# Patient Record
Sex: Female | Born: 1975 | Race: White | Hispanic: No | Marital: Married | Smoking: Never smoker
Health system: Southern US, Community
[De-identification: ages and names within clinical notes are randomized; demographics above are authoritative.]

## PROBLEM LIST (undated history)

## (undated) DIAGNOSIS — I10 Essential (primary) hypertension: Secondary | ICD-10-CM

## (undated) HISTORY — DX: Essential (primary) hypertension: I10

## (undated) HISTORY — PX: FRACTURE SURGERY: SHX138

---

## 2001-06-27 ENCOUNTER — Other Ambulatory Visit: Admission: RE | Admit: 2001-06-27 | Discharge: 2001-06-27 | Payer: Self-pay | Admitting: Obstetrics and Gynecology

## 2002-10-08 ENCOUNTER — Other Ambulatory Visit: Admission: RE | Admit: 2002-10-08 | Discharge: 2002-10-08 | Payer: Self-pay | Admitting: Obstetrics and Gynecology

## 2003-10-29 ENCOUNTER — Other Ambulatory Visit: Admission: RE | Admit: 2003-10-29 | Discharge: 2003-10-29 | Payer: Self-pay | Admitting: Obstetrics and Gynecology

## 2003-11-26 ENCOUNTER — Ambulatory Visit (HOSPITAL_COMMUNITY): Admission: RE | Admit: 2003-11-26 | Discharge: 2003-11-26 | Payer: Self-pay | Admitting: Internal Medicine

## 2004-10-11 ENCOUNTER — Emergency Department (HOSPITAL_COMMUNITY): Admission: EM | Admit: 2004-10-11 | Discharge: 2004-10-11 | Payer: Self-pay | Admitting: Emergency Medicine

## 2005-02-23 ENCOUNTER — Inpatient Hospital Stay (HOSPITAL_COMMUNITY): Admission: AD | Admit: 2005-02-23 | Discharge: 2005-02-23 | Payer: Self-pay | Admitting: Obstetrics and Gynecology

## 2005-03-02 ENCOUNTER — Ambulatory Visit (HOSPITAL_COMMUNITY): Admission: RE | Admit: 2005-03-02 | Discharge: 2005-03-02 | Payer: Self-pay | Admitting: Obstetrics and Gynecology

## 2005-03-07 ENCOUNTER — Inpatient Hospital Stay (HOSPITAL_COMMUNITY): Admission: AD | Admit: 2005-03-07 | Discharge: 2005-03-07 | Payer: Self-pay | Admitting: Obstetrics and Gynecology

## 2005-03-08 ENCOUNTER — Inpatient Hospital Stay (HOSPITAL_COMMUNITY): Admission: RE | Admit: 2005-03-08 | Discharge: 2005-03-11 | Payer: Self-pay | Admitting: Obstetrics and Gynecology

## 2005-03-08 ENCOUNTER — Encounter (INDEPENDENT_AMBULATORY_CARE_PROVIDER_SITE_OTHER): Payer: Self-pay | Admitting: Specialist

## 2005-03-12 ENCOUNTER — Encounter: Admission: RE | Admit: 2005-03-12 | Discharge: 2005-04-11 | Payer: Self-pay | Admitting: Obstetrics and Gynecology

## 2005-04-20 ENCOUNTER — Other Ambulatory Visit: Admission: RE | Admit: 2005-04-20 | Discharge: 2005-04-20 | Payer: Self-pay | Admitting: Obstetrics and Gynecology

## 2005-05-12 ENCOUNTER — Encounter: Admission: RE | Admit: 2005-05-12 | Discharge: 2005-06-02 | Payer: Self-pay | Admitting: Obstetrics and Gynecology

## 2006-08-08 ENCOUNTER — Other Ambulatory Visit: Admission: RE | Admit: 2006-08-08 | Discharge: 2006-08-08 | Payer: Self-pay | Admitting: Obstetrics and Gynecology

## 2007-07-23 ENCOUNTER — Other Ambulatory Visit: Admission: RE | Admit: 2007-07-23 | Discharge: 2007-07-23 | Payer: Self-pay | Admitting: Gynecology

## 2007-07-24 ENCOUNTER — Ambulatory Visit (HOSPITAL_COMMUNITY): Admission: RE | Admit: 2007-07-24 | Discharge: 2007-07-24 | Payer: Self-pay | Admitting: Gynecology

## 2008-09-18 ENCOUNTER — Other Ambulatory Visit: Admission: RE | Admit: 2008-09-18 | Discharge: 2008-09-18 | Payer: Self-pay | Admitting: Gynecology

## 2009-05-27 IMAGING — RF DG HYSTEROGRAM
6 series · 6 of 6 positions shown · non-contrast
Comparison: none

CLINICAL DATA: infertility
HYSTEROSALPINGOGRAM:
TECHNIQUE: The exam was performed by Dr. Gaber.  Six images are submitted for interpretation.

[Series 1: run · 1 of 1 slices shown (1 of 6)]
[im 1/1]
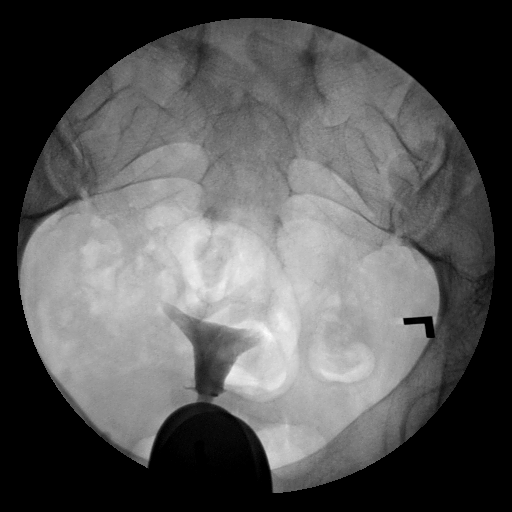

[Series 2: run · 1 of 1 slices shown (2 of 6)]
[im 1/1]
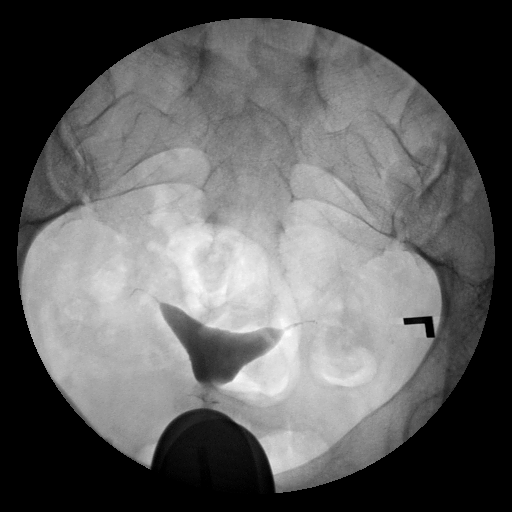

[Series 3: run · 1 of 1 slices shown (3 of 6)]
[im 1/1]
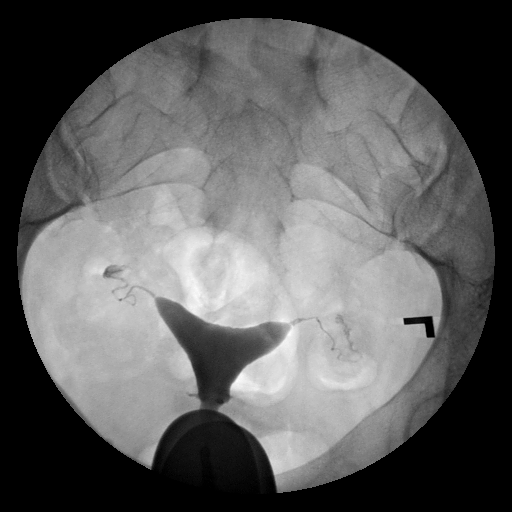

[Series 4: run · 1 of 1 slices shown (4 of 6)]
[im 1/1]
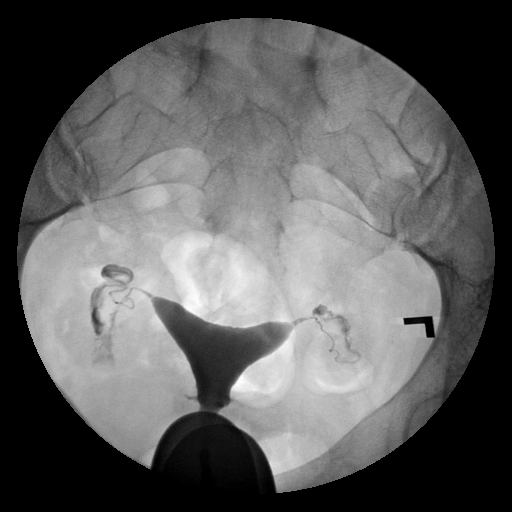

[Series 5: run · 1 of 1 slices shown (5 of 6)]
[im 1/1]
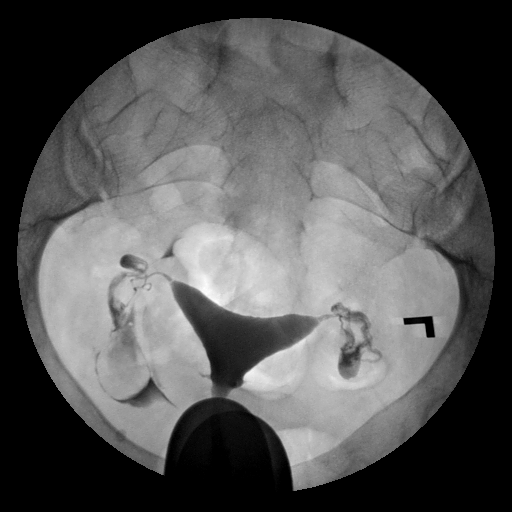

[Series 6: run · 1 of 1 slices shown (6 of 6)]
[im 1/1]
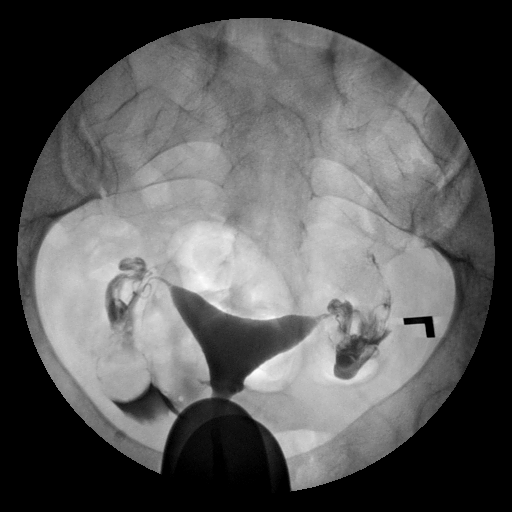

[6 of 6 positions shown; findings below may reference images not displayed]

FINDINGS: The uterus has a normal appearance with no evidence of fixed filling defects.  Both fallopian tubes fill and have a normal appearance, as well.  There is free spill of contrast into both adnexal regions.
IMPRESSION: Normal HSG.

## 2009-06-29 ENCOUNTER — Inpatient Hospital Stay (HOSPITAL_COMMUNITY): Admission: RE | Admit: 2009-06-29 | Discharge: 2009-07-02 | Payer: Self-pay | Admitting: Obstetrics and Gynecology

## 2010-12-19 ENCOUNTER — Encounter: Payer: Self-pay | Admitting: Obstetrics and Gynecology

## 2011-03-05 LAB — CBC
HCT: 26.4 % — ABNORMAL LOW (ref 36.0–46.0)
HCT: 31.7 % — ABNORMAL LOW (ref 36.0–46.0)
Hemoglobin: 10.5 g/dL — ABNORMAL LOW (ref 12.0–15.0)
Hemoglobin: 9 g/dL — ABNORMAL LOW (ref 12.0–15.0)
MCHC: 33.2 g/dL (ref 30.0–36.0)
MCHC: 34 g/dL (ref 30.0–36.0)
MCV: 76.7 fL — ABNORMAL LOW (ref 78.0–100.0)
MCV: 76.9 fL — ABNORMAL LOW (ref 78.0–100.0)
Platelets: 119 10*3/uL — ABNORMAL LOW (ref 150–400)
Platelets: 150 10*3/uL (ref 150–400)
RBC: 3.44 MIL/uL — ABNORMAL LOW (ref 3.87–5.11)
RBC: 4.12 MIL/uL (ref 3.87–5.11)
RDW: 16.8 % — ABNORMAL HIGH (ref 11.5–15.5)
RDW: 17 % — ABNORMAL HIGH (ref 11.5–15.5)
WBC: 7.7 10*3/uL (ref 4.0–10.5)
WBC: 8.9 10*3/uL (ref 4.0–10.5)

## 2011-03-05 LAB — RPR: RPR Ser Ql: NONREACTIVE

## 2011-04-12 NOTE — H&P (Signed)
Lauren Serrano, [name]             ACCOUNT NO.:  [id number]   MEDICAL RECORD NO.:  [id number]          PATIENT TYPE:  INP   LOCATION:  9139                          [facility]   PHYSICIAN:  Hal Morales, M.D.DATE OF BIRTH:  01-Nov-1976   DATE OF ADMISSION:  06/29/2009  DATE OF DISCHARGE:                              HISTORY & PHYSICAL   A 35 year old gravida 2, para 1 with last menstrual period September 27, 2008, equals an [facility] of July 04, 2009, presents to the [facility]  for repeat C-section due to large infant.  Ultrasound 1 week ago showed  the baby to be 8 pounds 12 ounces.   ADMITTING DIAGNOSES:  1. Intrauterine pregnancy, term.  2. History of macrosomia with previous pregnancy in September 2008.  3. Previous cesarean section, epidural, and elevated blood pressure.   Prenatal care began December 29, 2008.   LABORATORY DATA:  She is O positive, antibody screen nonreactive, RPR  nonreactive, HIV nonreactive, GC/CT negative.  Hemoglobin 11.4 and  platelets 187.  HBsAg is negative, rubella immune.  Pap within normal  limits.  First-trimester screen and AFI were in normal limits.  One-hour  Glucola was 141.  Three-hour was done.  GBS is positive.  AFI was within  normal limits.  Cervical length 4.06.  Placenta was anterior.   PAST OBSTETRIC HISTORY:  In 2006, a viable female infant, weighing 9  pounds 8 ounces by primary C-section due to elevated blood pressure,  present pregnancy.   MEDICAL HISTORY:  She has a history of infertility.  She was seen by Dr.  Chevis Pretty, Follistim was given with IUI; history of PIH; history of no known  drug allergies; previous pregnancy-induced hypertension, last trimester,  last pregnancy.   PAST SURGICAL HISTORY:  She had a primary C-section for failure to  progress.  She had a fracture noted in 2006.   ALLERGIES:  No known drug allergies or drugs.   FAMILY HISTORY:  Heart disease in mother and her maternal grandmother,  both have  heart disease and hypertension; her father is diabetic, IDDM;  cancer in her paternal grandmother, had lung cancer; and maternal  grandfather had brain cancer.   GENETIC HISTORY:  Noncontributory.   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  Denies smoking, alcohol, or drugs.  She did take Keflex  this pregnancy for urinary tract infection.  Has 1 daughter named, Victorino Serrano,  born in 2006 per primary C-section.   REVIEW OF SYMPTOMS:  GENERAL:  Denies headaches, dizziness, shortness of  breath, baby active.     EENT:  Grossly normal.  HEART:  Regular rate without murmur.  LUNGS:  Clear bilaterally.  ABDOMEN:  C-section scar, gravid at 8 pounds 12  ounces per ultrasound 1 week ago.  PELVIC:  Deferred.  No contractions.   ASSESSMENT:  Intrauterine pregnancy at term,  repeat cesarean section  versus vaginal birth after cesarean section, elected to have a repeat  cesarean section.  The risks and benefits were discussed per Dr.  Pennie Rushing, and she does have positive group  B streptococcus.   PLAN:  Admit to [facility] under services of [place], Dr. Pennie Rushing, anesthesia per Anesthesia Department.  Routine  postpartum care.  Routine C-section care.      Lauren Serrano, CNM      Hal Morales, M.D.  Electronically Signed    JM/MEDQ  D:  06/29/2009  T:  06/30/2009  Job:  [id number]

## 2011-04-12 NOTE — Op Note (Signed)
Lauren Serrano, Lauren Serrano             ACCOUNT NO.:  [id number]   MEDICAL RECORD NO.:  [id number]          PATIENT TYPE:  INP   LOCATION:  9139                          [facility]   PHYSICIAN:  Hal Morales, M.D.DATE OF BIRTH:  01-27-1976   DATE OF PROCEDURE:  06/29/2009  DATE OF DISCHARGE:                               OPERATIVE REPORT   PREOPERATIVE DIAGNOSES:  Intrauterine pregnancy at term, prior cesarean  section, desire for repeat cesarean section, history of macrosomia.   POSTOPERATIVE DIAGNOSES:  Intrauterine pregnancy at term, prior cesarean  section, desire for repeat cesarean section, history of macrosomia, plus  fetal macrosomia.   PROCEDURE:  Repeat low-transverse cesarean section.   SURGEON:  Hal Morales, MD   FIRST ASSISTANT:  Jasmine Awe, certified nurse midwife.   ANESTHESIA:  Spinal.   ESTIMATED BLOOD LOSS:  600 mL.   COMPLICATIONS:  None.   FINDINGS:  The patient was delivered of a female infant whose name is  Barnetta Chapel, weighing 9 pounds and 9 ounces with Apgars of 9 and 9 at 1 and 5  minutes respectively.  The uterus, tubes, and ovaries were normal for  gravid state.  The placenta contained an eccentrically inserted 3-vessel  cord.   PROCEDURE:  The patient was taken to the operating room after  appropriate identification and placed on the operating table.  After the  placement of a spinal anesthetic, she was placed in the supine position  with a left lateral tilt.  The abdomen and perineum were prepped with  multiple layers of Betadine and a Foley catheter inserted into the  bladder and connected to straight drainage.  The abdomen was draped as a  sterile field.  After the assurance of adequate surgical anesthesia, the  suprapubic site at the previous cesarean section incision was  infiltrated with 20 mL of 0.25% Marcaine.  This area was incised and the  incision taken through the layers into the peritoneum.  The bladder  blade was  placed.  The uterus was incised approximately 2 cm above the  uterovesical fold and that incision taken laterally on either side  bluntly.  The infant was delivered with the aid of a kiwi vacuum  extractor and after having the nares and pharynx suctioned and a double  loop of nuchal cord reduced was handed off to the awaiting  pediatricians.  The appropriate cord blood was drawn.  The placenta was  allowed to separate from the uterus and was removed from the operative  field.  The uterine incision was closed with running interlocking suture  of 0 Vicryl.  An imbricating suture of 0 Vicryl was placed with adequate  hemostasis.  Copious irrigation was carried out.  The abdominal  peritoneum was closed with running suture of 2-0 Vicryl.  The rectus  muscles were approximated in the midline with figure-of-eight suture of  2-0 Vicryl.  The rectus fascia was closed with running suture of 0  Vicryl then reinforced on either side of midline with figure-of-eight  sutures of 0 Vicryl.  Subcutaneous tissue was made hemostatic with Bovie  cautery and irrigated.  A  subcutaneous 7-mm Jackson-Pratt drain was  placed through a stab wound in the left lower quadrant and sewn in with  a suture of 0 silk.  The skin incision was closed with a  subcuticular suture of 3-0 Monocryl.  Steri-Strips were applied and a  sterile dressing applied.  The patient was taken from the operating room  to the recovery room in satisfactory condition, having tolerated the  procedure well with sponge and instrument counts correct.  The infant  went to the full-term nursery.      Hal Morales, M.D.  Electronically Signed     VPH/MEDQ  D:  06/29/2009  T:  06/30/2009  Job:  [id number]

## 2011-04-12 NOTE — Discharge Summary (Signed)
Lauren Serrano, [name]             ACCOUNT NO.:  [id number]   MEDICAL RECORD NO.:  [id number]          PATIENT TYPE:  INP   LOCATION:  9139                          [facility]   PHYSICIAN:  Hal Morales, M.D.DATE OF BIRTH:  Dec 21, 1975   DATE OF ADMISSION:  06/29/2009  DATE OF DISCHARGE:  07/02/2009                               DISCHARGE SUMMARY   ADMITTING DIAGNOSES:  1. Intrauterine pregnancy at term.  2. History of Macrosomia.  3. Previous cesarean section with desire for repeat.   PROCEDURE:  Repeat low transverse cesarean section.   HOSPITAL COURSE:  Ms. Rota is a 35 year old gravida 2, para 1-0-0-1  at 39-2/7 weeks, who presented on the day of admission for a repeat  cesarean section secondary to anticipated macrosomia.  The patient's  pregnancy had been remarkable for;  1. Previous C-section with original desire to VBAC; however, now      desire for repeat C-section.  2. History of fetal macrosomia.  3. History of elevated blood pressure in previous pregnancy.   Hemoglobin on day of admission was 10.5, white blood cell count was 7.7,  and platelet count was 150.  The patient was taken to the operating room  by Dr. Pennie Rushing, where a repeat low transverse cesarean section was  performed under spinal anesthesia.  Findings were a viable female by the  name of Lauren Serrano, weight 9 pounds 9 ounces, Apgars were 9 and 9.  The  infant was taken to the Full-Term Nursery.  Mother was taken to Recovery  in good condition.  The patient also was positive group B strep.  The  patient also had a fetal heart rate arrhythmia noted.  There was a  postnatal EKG scheduled in the nursery.  This was done and the baby was  cleared for discharge.  On postop day #1, hemoglobin was 9, hematocrit  was 26.4, white blood cell count was 8.9 and platelet count was 119.  The patient was up ad lib.  She was doing well.  She is having no  syncopal episodes or dizziness.  She had a JP drain that was  draining a  small amount of serosanguineous fluid.  Her incision was clean, dry and  intact.  Physical exam was within normal limits.  She was using Percocet  and Motrin for pain.  The rest of patient's postop care was  uncomplicated.  She was breastfeeding.   By postop day #3. the patient was doing well.  She was planning to use  Micronor.  Infant was cleared for circ and discharge after a normal EKG.  There were some irregular beats noted, but no pathology was identified.  Her incision was clean, dry and intact.  Her JP drain was removed  without difficulty.  Her fundus was firm and her lochia was scant.  She  was deemed to receive full benefit of her hospital stay and was  discharged home in stable condition.   DISCHARGE INSTRUCTIONS:  Per [facility] handout.   DISCHARGE MEDICATIONS:  1. Motrin 600 mg p.o. q.6 h. p.r.n. pain.  2. Percocet 5/325 one to two  p.o. daily 3-4 hours p.r.n. pain.  3. Micronor 1 p.o. daily.  4. The patient will also be 1 tablet of over-the-counter iron      supplement daily.   DISCHARGE FOLLOWUP:  Occur in 6 weeks, [facility].      Renaldo Reel Emilee Hero, C.N.M.      Hal Morales, M.D.  Electronically Signed    VLL/MEDQ  D:  07/02/2009  T:  07/02/2009  Job:  [id number]

## 2011-04-15 NOTE — H&P (Signed)
Lauren Serrano, Lauren Serrano             ACCOUNT NO.:  [id number]   MEDICAL RECORD NO.:  [id number]          PATIENT TYPE:  INP   LOCATION:  NA                            [facility]   PHYSICIAN:  Osborn Coho, M.D.   DATE OF BIRTH:  [birth year]   DATE OF ADMISSION:  03/08/2005  DATE OF DISCHARGE:                                HISTORY & PHYSICAL   Lauren Serrano is a 35 year old, married white female, gravida 1, para 0, at 44-  4/7 weeks on admission, who is admitted for elective primary low transverse  cesarean section secondary to documentation of macrosomia on most recent  ultrasound.  She had her most recent ultrasound on March 04, 2005, and was  noted to have an estimated fetal weight of 4600 g.  She has had the risks  and benefits of the procedure of induction of labor, awaiting spontaneous  labor, and primary C-section, all reviewed at length with her and has  elected to proceed with an elective C-section for delivery.  She denies any  regular uterine contractions.  She denies any nausea, vomiting, headaches,  or visual disturbances.  Her pregnancy has been followed at [facility] by the certified nurse midwife service to date and has been  essentially uncomplicated though at risk for obesity, history of  infertility, bilateral choroid plexus cyst noted on 18 week ultrasound,  borderline elevated AFI, and now documentation of estimated fetal weight  greater than 4600 g.  Also on March 07, 2005, she was evaluated for some  elevations in her blood pressure which stabilized with rest.   OBSTETRIC AND GYNECOLOGIC HISTORY:  1.  She is a primigravida with an LMP of May 28, 2004, giving [facility] of March 04, 2005, confirmed by early ultrasound.  2.  She has had a history of infertility and actually saw Dr. Chevis Pretty prior to      her conception.  3.  She had a hysterosalpingogram in July and has been told that she has a      heart-shaped uterus.   GENERAL MEDICAL HISTORY:  1.   She has no known drug allergies.  2.  She reports having had the usual childhood diseases.  3.  She has no other medical issues other than an accident where she broke      her nose in college and had nose surgery.   FAMILY HISTORY:  Significant for maternal grandmother with heart disease and  hypertension.  Father with emphysema and diabetes.   GENETIC HISTORY:  Negative.   SOCIAL HISTORY:  She is married to [employer], who is involved and  supportive.  They are both employed full time.  They are of the [place]  faith.  They deny any illicit drug use, alcohol, or smoking with this  pregnancy.   PRENATAL LABORATORY DATA:  Her blood type is O positive.  Her antibody  screen is negative.  Syphilis is nonreactive.  Rubella is immune.  Hepatitis  B surface antigen is negative.  GC and Chlamydia are both negative.  Pap was  within normal limits,  and her 1 hour Glucola was normal, and her 36 week  beta strep was negative.   PHYSICAL EXAMINATION:  VITAL SIGNS:  Her blood pressures on March 07, 2005,  are 131-141/76-93.  She is afebrile.  HEENT:  Grossly within normal limits.  HEART:  Regular rhythm and rate.  CHEST:  Clear.  BREASTS:  Soft and nontender.  ABDOMEN:  Gravid with a fundal height of approximately 41 cm.  Fetal heart  rate is reactive and reassuring.  Uterine contractions are rare, and her  cervix is 1 cm, 50%, and -2.   Her hemoglobin is 9.9, hematocrit is 29.8, platelets are 183, SGOT is 14,  SGPT is 9, creatinine is 0.5, LDH is 141, and uric acid is 3.6.  A clean  catch urine today on March 07, 2005 was trace for protein.   ASSESSMENT:  1.  Intrauterine pregnancy at 40-3/7 weeks.  2.  Macrosomia on ultrasound.  3.  Borderline pregnancy-induced hypertension.   PLAN:  Admit on March 08, 2005 for elective primary low transverse cesarean  section.  The patient's choice due to macrosomia.  This plan was made in  consultation with Dr. Leonard Schwartz, who has  scheduled the cesarean  section.      SJD/MEDQ  D:  03/07/2005  T:  03/07/2005  Job:  [id number]

## 2011-04-15 NOTE — Discharge Summary (Signed)
NAMEPANG, ROBERS             ACCOUNT NO.:  [id number]   MEDICAL RECORD NO.:  [id number]          PATIENT TYPE:  INP   LOCATION:  9102                          [facility]   PHYSICIAN:  Osborn Coho, M.D.   DATE OF BIRTH:  June 10, 1976   DATE OF ADMISSION:  03/08/2005  DATE OF DISCHARGE:  03/11/2005                                 DISCHARGE SUMMARY   ADMISSION DIAGNOSES:  1.  Intrauterine pregnancy at 40+ weeks.  2.  Macrosomia.  3.  Late gestational hypertension.   DISCHARGE DIAGNOSES:  1.  Intrauterine pregnancy at 40+ weeks.  2.. Macrosomia.  1.  Late gestational hypertension.  2.  Status post primary low transverse cesarean section of a viable female      infant, Apgars 9 and 9, weighing 9 pounds, 8 ounces.   HOSPITAL PROCEDURES:  1.  Spinal anesthesia.  2.  Primary low-transverse cesarean section via Pfannenstiel.   HOSPITAL COURSE:  The patient was admitted for an elective primary cesarean  section for macrosomia.  This was performed under spinal anesthesia by Dr.  Su Hilt with an estimated blood loss of 700 cubic centimeters.  Baby and  mother both did well . Mother was taken to recovery and then to mother/baby  unit where she had a normal recovery.  She was breastfeeding well.  On  postoperative day #1, she had some edema of her panniculus above the  incision but did not show signs of infection.  On postoperative day #2, she  was doing well.  Blood pressures were 113 to 130/70 to 90.  She was  ambulating and eating without difficulty.  On postoperative day #3, she was  ready to go home, vital signs were stable, blood pressure 124/82.  Weight  was 229.  Chest was clear.  Heart rate regular rate and rhythm.  Abdomen was  soft and appropriately tender.  Incision was clean, dry, and intact.  Lochia  was small.  Extremities were within normal limits.  So, she was deemed to  receive full benefit of her hospital stay and was discharged home.   DISCHARGE LABORATORIES:   White blood cell count 10.3, hemoglobin 9.2,  platelets 197.   DISCHARGE MEDICATIONS:  1.  Tylox one to two p.o. q.4h. p.r.n.  2.  Motrin 600 mg p.o. q.6h. p.r.n.   DISCHARGE INSTRUCTIONS:  Per [facility] handout.  Discharge follow-up in six weeks  or p.r.n.      MLW/MEDQ  D:  03/11/2005  T:  03/11/2005  Job:  [id number]

## 2011-04-15 NOTE — Op Note (Signed)
NAMECRISTY, Lauren Serrano             ACCOUNT NO.:  [id number]   MEDICAL RECORD NO.:  [id number]          PATIENT TYPE:  INP   LOCATION:  9198                          [facility]   PHYSICIAN:  Osborn Coho, M.D.   DATE OF BIRTH:  Aug 01, 1976   DATE OF PROCEDURE:  03/08/2005  DATE OF DISCHARGE:                                 OPERATIVE REPORT   PREOPERATIVE DIAGNOSES:  1.  Forty-week intrauterine pregnancy.  2.  Macrosomia.   POSTOPERATIVE DIAGNOSES:  1.  Forty-week intrauterine pregnancy.  2.  Macrosomia.   PROCEDURE:  Primary low transverse cesarean section via Pfannenstiel skin  incision.   ATTENDING:  Osborn Coho, M.D.   ASSISTANT:  Renaldo Reel. Emilee Hero, C.N.M.   ANESTHESIA:  Spinal.   ESTIMATED BLOOD LOSS:  700 mL.   FLUIDS:  3900 mL.   URINE OUTPUT:  150 mL.   Placenta to pathology.   COMPLICATIONS:  None.   FINDINGS:  A live female infant with Apgars of 9 at one minute and 9 at five  minutes.  Infant's name is Merita Norton.  Weight is 9 pounds 8 ounces.  Normal-  appearing bilateral ovaries and fallopian tubes.   PROCEDURE:  The patient was taken to the operating room after the risks,  benefits, and alternatives were discussed with the patient, the patient  verbalized understanding and consent signed and witnessed.  The patient was  given a spinal per anesthesia and prepped and draped in the normal sterile  fashion.  A Pfannenstiel skin incision was made and carried down to the  underlying layer of fascia with the scalpel.  The fascia was excised  bilaterally in the midline, extended bilaterally with the Mayo scissors.  Kocher clamps were placed on the inferior aspect of the fascial incision and  the rectus muscle excised from the fascia.  The same was done on the  superior aspect of the fascial incision.  The muscle was separated in the  midline and the peritoneum entered bluntly.  The peritoneum was extended  manually and bladder blade placed for retraction.   The bladder flap was  created with the Metzenbaum scissors.  The uterine incision was made with a  scalpel and extended bilaterally with the bandage scissors.  The infant was  delivered and the oropharynx and nasopharynx were bulb-suctioned and the  oropharynx was DeLee-suctioned.  The remainder of the infant was delivered  without difficulty, and there was a body cord noted.  The uterus was cleared  of all clots and debris.  The uterine incision was repaired with 0 Vicryl in  a running locked fashion and a second imbricating layer was performed.  The  adnexal findings as mentioned above.  The intra-abdominal cavity was  copiously irrigated.  The peritoneum was closed with 3-0 chromic in a  running fashion.  The fascia was repaired with 0 Vicryl in a running  fashion.  The subcutaneous tissue was reapproximated using 0 plain.  A  subcuticular stitch was placed using 3-0 Monocryl.  Sponge, lap and needle  count was correct.  The patient tolerated the procedure well and is awaiting  transfer to the recovery room.      AR/MEDQ  D:  03/08/2005  T:  03/08/2005  Job:  [id number]

## 2011-10-12 LAB — HM HIV SCREENING LAB: HM HIV Screening: NEGATIVE

## 2011-10-12 LAB — HM HEPATITIS C SCREENING LAB: HM Hepatitis Screen: NEGATIVE

## 2012-08-22 ENCOUNTER — Other Ambulatory Visit: Payer: Self-pay | Admitting: Obstetrics and Gynecology

## 2012-08-22 ENCOUNTER — Telehealth: Payer: Self-pay | Admitting: Obstetrics and Gynecology

## 2012-08-22 MED ORDER — NORGESTIM-ETH ESTRAD TRIPHASIC 0.18/0.215/0.25 MG-35 MCG PO TABS: 1.0000 | ORAL_TABLET | Freq: Every day | ORAL | Status: DC

## 2012-08-22 NOTE — Telephone Encounter (Signed)
Fax received for Rf OCP. TC to pt.  Pt denies any problems or any new medical issues. Has annual 10/09/12. Wil Rf until then.

## 2012-10-09 ENCOUNTER — Ambulatory Visit: Payer: Self-pay | Admitting: Obstetrics and Gynecology

## 2012-10-17 ENCOUNTER — Encounter: Payer: Self-pay | Admitting: Obstetrics and Gynecology

## 2012-10-17 ENCOUNTER — Ambulatory Visit (INDEPENDENT_AMBULATORY_CARE_PROVIDER_SITE_OTHER): Payer: PRIVATE HEALTH INSURANCE | Admitting: Obstetrics and Gynecology

## 2012-10-17 VITALS — BP 104/72 | Ht 64.0 in | Wt 176.0 lb

## 2012-10-17 DIAGNOSIS — Z01419 Encounter for gynecological examination (general) (routine) without abnormal findings: Secondary | ICD-10-CM

## 2012-10-17 DIAGNOSIS — O34219 Maternal care for unspecified type scar from previous cesarean delivery: Secondary | ICD-10-CM

## 2012-10-17 NOTE — Progress Notes (Signed)
Regular Periods: yes Mammogram: no  Monthly Breast Ex.: no Exercise: yes  Tetanus < 10 years: yes Seatbelts: yes  NI. Bladder Functn.: yes Abuse at home: no  Daily BM's: yes Stressful Work: yes  Healthy Diet: yes Sigmoid-Colonoscopy: never had one   Calcium: yes Medical problems this year: none   LAST PAP:08/10/2011  Contraception: othrotricyclen lo   Mammogram:  Never had one   PCP: none  PMH: unchanged   FMH: unchanged

## 2012-10-17 NOTE — Progress Notes (Signed)
Subjective:    Lauren Serrano is a 36 y.o. female, G2P2002, who presents for an annual exam.   Patient reports:  No issues.  Wants to continue Orthotrycyclen.    History   Social History  . Marital Status: Married    Spouse Name: N/A    Number of Children: N/A  . Years of Education: N/A   Social History Main Topics  . Smoking status: Never Smoker   . Smokeless tobacco: Never Used  . Alcohol Use: No  . Drug Use: No  . Sexually Active: Yes    Birth Control/ Protection: Pill   Other Topics Concern  . None   Social History Narrative  . None    Menstrual cycle:   LMP: Patient's last menstrual period was 09/19/2012.           Cycle: WNL on OCPs  The following portions of the patient's history were reviewed and updated as appropriate: allergies, current medications, past family history, past medical history, past social history, past surgical history and problem list.  Review of Systems Pertinent items are noted in HPI. Breast:Negative for breast lump,nipple discharge or nipple retraction Gastrointestinal: Negative for abdominal pain, change in bowel habits or rectal bleeding Urinary:negative   Objective:    BP 104/72  Ht 5\' 4"  (1.626 m)  Wt 176 lb (79.833 kg)  BMI 30.21 kg/m2  LMP 09/19/2012    Weight:  Wt Readings from Last 1 Encounters:  10/17/12 176 lb (79.833 kg)          BMI: Body mass index is 30.21 kg/(m^2).  General Appearance: Alert, appropriate appearance for age. No acute distress HEENT: Grossly normal Neck / Thyroid: Supple, no masses, nodes or enlargement Lungs: clear to auscultation bilaterally Back: No CVA tenderness Breast Exam: No masses or nodes.No dimpling, nipple retraction or discharge. Cardiovascular: Regular rate and rhythm. S1, S2, no murmur Gastrointestinal: Soft, non-tender, no masses or organomegaly Pelvic Exam: Vulva and vagina appear normal. Bimanual exam reveals normal uterus and adnexa. Rectovaginal: normal rectal, no  masses Lymphatic Exam: Non-palpable nodes in neck, clavicular, axillary, or inguinal regions Skin: no rash or abnormalities Neurologic: Normal gait and speech, no tremor  Psychiatric: Alert and oriented, appropriate affect.   Wet Prep:not applicable Urinalysis:not applicable UPT: Not done   Assessment:    Normal gyn exam  Wants to continue Orthotricyclen   Plan:    Mammogram: Age 19 Pap:  Done today STD screening: declined Contraception:oral contraceptives (estrogen/progesterone)       Gael Delude, [place], [place]

## 2012-10-18 DIAGNOSIS — O34219 Maternal care for unspecified type scar from previous cesarean delivery: Secondary | ICD-10-CM | POA: Insufficient documentation

## 2012-10-18 MED ORDER — NORGESTIM-ETH ESTRAD TRIPHASIC 0.18/0.215/0.25 MG-35 MCG PO TABS: 1.0000 | ORAL_TABLET | Freq: Every day | ORAL | Status: DC

## 2012-10-19 LAB — PAP IG W/ RFLX HPV ASCU

## 2012-11-06 ENCOUNTER — Ambulatory Visit: Payer: Self-pay | Admitting: Obstetrics and Gynecology

## 2012-12-03 ENCOUNTER — Other Ambulatory Visit: Payer: Self-pay | Admitting: Obstetrics and Gynecology

## 2012-12-03 MED ORDER — NORGESTIM-ETH ESTRAD TRIPHASIC 0.18/0.215/0.25 MG-35 MCG PO TABS: 1.0000 | ORAL_TABLET | Freq: Every day | ORAL | Status: DC

## 2012-12-03 NOTE — Telephone Encounter (Signed)
Tc to pt regarding msg.  Pt informed rx was e-prescribed to her pharmacy, pt states the pharmacy says they do not have the rx.  Rx for pt bc pills resent via  E-prescribe to pt's pharmacy.

## 2014-09-29 ENCOUNTER — Encounter: Payer: Self-pay | Admitting: Obstetrics and Gynecology

## 2020-01-15 ENCOUNTER — Other Ambulatory Visit: Payer: Self-pay | Admitting: Obstetrics and Gynecology

## 2022-01-05 ENCOUNTER — Ambulatory Visit: Payer: Self-pay | Admitting: Family Medicine

## 2022-01-06 ENCOUNTER — Encounter: Payer: Self-pay | Admitting: Family Medicine

## 2022-01-06 ENCOUNTER — Ambulatory Visit: Payer: Managed Care, Other (non HMO) | Admitting: Family Medicine

## 2022-01-06 VITALS — BP 129/89 | HR 78 | Temp 97.5°F | Ht 64.0 in | Wt 167.2 lb

## 2022-01-06 DIAGNOSIS — I10 Essential (primary) hypertension: Secondary | ICD-10-CM | POA: Diagnosis not present

## 2022-01-06 DIAGNOSIS — Z7689 Persons encountering health services in other specified circumstances: Secondary | ICD-10-CM

## 2022-01-06 DIAGNOSIS — Z23 Encounter for immunization: Secondary | ICD-10-CM

## 2022-01-06 DIAGNOSIS — Z6828 Body mass index (BMI) 28.0-28.9, adult: Secondary | ICD-10-CM | POA: Diagnosis not present

## 2022-01-06 MED ORDER — HYDROCHLOROTHIAZIDE 25 MG PO TABS: 25.0000 mg | ORAL_TABLET | Freq: Every day | ORAL | 3 refills | Status: DC

## 2022-01-06 NOTE — Progress Notes (Signed)
Subjective:  Patient ID: Lauren Serrano, female    DOB: Feb 16, 1976, 46 y.o.   MRN: [id number]  Patient Care Team: Sonny Masters, FNP as PCP - General (Family Medicine)   Chief Complaint:  New Patient (Initial Visit)   HPI: Lauren Serrano is a 46 y.o. female presenting on 01/06/2022 for New Patient (Initial Visit)   Lauren Serrano is a 46 year old female presenting to establish care here. She reports a new diagnosis of hypertension in the past year that is controlled by 25 mg HCTZ. She is a never smoker. Pt reports having labs drawn in January. Her last PAP was 1 year ago. She has a mammogram scheduled for April. Her tetanus vaccination is overdue. Family history significant for cardiovascular events in her mother (5 events), maternal grandfather and maternal uncles. Her father has type 2 diabetes and emphysema.     Relevant past medical, surgical, family, and social history reviewed and updated as indicated.  Allergies and medications reviewed and updated. Data reviewed: Chart in Epic.   History reviewed. No pertinent past medical history.  Past Surgical History:  Procedure Laterality Date   CESAREAN SECTION     times 2    FRACTURE SURGERY     ankle    Social History   Socioeconomic History   Marital status: Married    Spouse name: Not on file   Number of children: Not on file   Years of education: Not on file   Highest education level: Not on file  Occupational History   Not on file  Tobacco Use   Smoking status: Never   Smokeless tobacco: Never  Substance and Sexual Activity   Alcohol use: No   Drug use: No   Sexual activity: Yes    Birth control/protection: Pill  Other Topics Concern   Not on file  Social History Narrative   Not on file   Social Determinants of Health   Financial Resource Strain: Not on file  Food Insecurity: Not on file  Transportation Needs: Not on file  Physical Activity: Not on file  Stress: Not on file  Social Connections:  Not on file  Intimate Partner Violence: Not on file    Outpatient Encounter Medications as of 01/06/2022  Medication Sig   hydrochlorothiazide (HYDRODIURIL) 25 MG tablet Take 1 tablet (25 mg total) by mouth daily.   Norgestimate-Ethinyl Estradiol Triphasic (ORTHO TRI-CYCLEN, 28,) 0.18/0.215/0.25 MG-35 MCG tablet Take 1 tablet by mouth daily.   [DISCONTINUED] hydrochlorothiazide (HYDRODIURIL) 12.5 MG tablet Take 12.5 mg by mouth in the morning and at bedtime.   No facility-administered encounter medications on file as of 01/06/2022.    Not on File  Review of Systems  Constitutional:  Negative for fatigue and fever.  Respiratory:  Negative for shortness of breath.   Cardiovascular:  Negative for chest pain, palpitations and leg swelling.  Endocrine: Negative for cold intolerance, heat intolerance, polydipsia, polyphagia and polyuria.  All other systems reviewed and are negative.      Objective:  BP 129/89    Pulse 78    Temp (!) 97.5 F (36.4 C) (Temporal)    Ht 5\' 4"  (1.626 m)    Wt 75.8 kg    BMI 28.70 kg/m    Wt Readings from Last 3 Encounters:  01/06/22 75.8 kg  10/17/12 79.8 kg    Physical Exam Vitals reviewed.  Constitutional:      General: She is not in acute distress.    Appearance:  Normal appearance. She is normal weight. She is not ill-appearing, toxic-appearing or diaphoretic.  HENT:     Head: Normocephalic and atraumatic.  Eyes:     Pupils: Pupils are equal, round, and reactive to light.  Cardiovascular:     Rate and Rhythm: Normal rate and regular rhythm.     Pulses: Normal pulses.     Heart sounds: Normal heart sounds.  Pulmonary:     Effort: Pulmonary effort is normal.     Breath sounds: Normal breath sounds.  Abdominal:     Tenderness: There is no abdominal tenderness.  Musculoskeletal:        General: Normal range of motion.     Cervical back: Normal range of motion and neck supple.  Skin:    General: Skin is warm and dry.     Capillary Refill:  Capillary refill takes less than 2 seconds.  Neurological:     General: No focal deficit present.     Mental Status: She is alert and oriented to person, place, and time. Mental status is at baseline.  Psychiatric:        Mood and Affect: Mood normal.        Behavior: Behavior normal.        Thought Content: Thought content normal.        Judgment: Judgment normal.    Results for orders placed or performed in visit on 10/17/12  Pap IG w/ reflex to HPV when ASC-U  Result Value Ref Range   Specimen adequacy:     FINAL DIAGNOSIS:     COMMENTS:     Cytotechnologist:     QC Cytotechnologist:       EKG in office: SR, 69, PR 134 ms, QT 404 ms, no acute ST-T changes, no ectopy. No prior EKG for comparison. NSST changes in anterior leads. Kari Baars, FNP-C  Pertinent labs & imaging results that were available during my care of the patient were reviewed by me and considered in my medical decision making.  Assessment & Plan:  Lauren Serrano was seen today for new patient (initial visit).  Diagnoses and all orders for this visit:  Encounter to establish care Hypertension, unspecified type - hydrochlorothiazide (HYDRODIURIL) 25 MG tablet; Take 1 tablet (25 mg total) by mouth daily. - Patient denies dyspnea, palpitations, or chest pain. Significant family history of cardiovascular disease. Will obtain micro albumin today. EKG in office negative for acute changes, no previous EKG for comparison. Will continue current regimen of HCTZ and obtain BMP as patient has not had lab work since beginning HCTZ. Will calculate ASCVD score after obtaining lab work.   Need for vaccination       - Tetanus updated today.   BMI 28.0-28.9 Diet and exercise encouraged.     Continue all other maintenance medications.  Follow up plan: Return in 6 months (on 07/06/2022), or if symptoms worsen or fail to improve, for CPE .   Continue healthy lifestyle choices, including diet (rich in fruits, vegetables, and lean  proteins, and low in salt and simple carbohydrates) and exercise (at least 30 minutes of moderate physical activity daily).  Educational handout given for health maintenance.   The above assessment and management plan was discussed with the patient. The patient verbalized understanding of and has agreed to the management plan. Patient is aware to call the clinic if they develop any new symptoms or if symptoms persist or worsen. Patient is aware when to return to the clinic for a follow-up visit. Patient  educated on when it is appropriate to go to the emergency department.   Thedora Hinders Bernal Luhman NP-S   I personally was present during the history, physical exam, and medical decision-making activities of this visit and have verified that the services and findings are accurately documented in the nurse practitioner student's note.  Kari Baars, FNP-C [facility] [address] [place], [place] [address] [phone]

## 2022-01-06 NOTE — Patient Instructions (Signed)

## 2022-01-07 LAB — MICROALBUMIN / CREATININE URINE RATIO
Creatinine, Urine: 60.3 mg/dL
Microalb/Creat Ratio: 30 mg/g creat — ABNORMAL HIGH (ref 0–29)
Microalbumin, Urine: 18.1 ug/mL

## 2022-01-08 LAB — LIPID PANEL
Chol/HDL Ratio: 2.7 ratio (ref 0.0–4.4)
Cholesterol, Total: 188 mg/dL (ref 100–199)
HDL: 70 mg/dL (ref >39)
LDL Chol Calc (NIH): 92 mg/dL (ref 0–99)
Triglycerides: 153 mg/dL — ABNORMAL HIGH (ref 0–149)
VLDL Cholesterol Cal: 26 mg/dL (ref 5–40)

## 2022-01-08 LAB — THYROID PANEL WITH TSH
Free Thyroxine Index: 2.2 (ref 1.2–4.9)
T3 Uptake Ratio: 22 % — ABNORMAL LOW (ref 24–39)
T4, Total: 10.2 ug/dL (ref 4.5–12.0)
TSH: 1.34 u[IU]/mL (ref 0.450–4.500)

## 2022-01-08 LAB — BASIC METABOLIC PANEL
BUN/Creatinine Ratio: 14 (ref 9–23)
BUN: 10 mg/dL (ref 6–24)
CO2: 21 mmol/L (ref 20–29)
Calcium: 10 mg/dL (ref 8.7–10.2)
Chloride: 95 mmol/L — ABNORMAL LOW (ref 96–106)
Creatinine, Ser: 0.69 mg/dL (ref 0.57–1.00)
Glucose: 99 mg/dL (ref 70–99)
Potassium: 4 mmol/L (ref 3.5–5.2)
Sodium: 136 mmol/L (ref 134–144)
eGFR: 110 mL/min/{1.73_m2} (ref >59)

## 2022-01-13 ENCOUNTER — Encounter: Payer: Self-pay | Admitting: Family Medicine

## 2022-01-13 DIAGNOSIS — N904 Leukoplakia of vulva: Secondary | ICD-10-CM | POA: Insufficient documentation

## 2022-07-06 ENCOUNTER — Encounter: Payer: Self-pay | Admitting: Family Medicine

## 2022-07-06 ENCOUNTER — Ambulatory Visit (INDEPENDENT_AMBULATORY_CARE_PROVIDER_SITE_OTHER): Payer: Managed Care, Other (non HMO) | Admitting: Family Medicine

## 2022-07-06 VITALS — BP 123/87 | HR 79 | Temp 98.9°F | Ht 64.0 in | Wt 166.5 lb

## 2022-07-06 DIAGNOSIS — Z130 Encounter for screening for diseases of the blood and blood-forming organs and certain disorders involving the immune mechanism: Secondary | ICD-10-CM | POA: Diagnosis not present

## 2022-07-06 DIAGNOSIS — Z0000 Encounter for general adult medical examination without abnormal findings: Secondary | ICD-10-CM

## 2022-07-06 DIAGNOSIS — Z1329 Encounter for screening for other suspected endocrine disorder: Secondary | ICD-10-CM

## 2022-07-06 DIAGNOSIS — Z13228 Encounter for screening for other metabolic disorders: Secondary | ICD-10-CM | POA: Diagnosis not present

## 2022-07-06 DIAGNOSIS — Z13220 Encounter for screening for lipoid disorders: Secondary | ICD-10-CM

## 2022-07-06 NOTE — Progress Notes (Signed)
Subjective:  Patient ID: Lauren Serrano, female    DOB: [birth year], 46 y.o.   MRN: [id number]  Patient Care Team: Baruch Gouty, FNP as PCP - General (Family Medicine)   Chief Complaint:  Annual Exam   HPI: Lauren Serrano is a 46 y.o. female presenting on 07/06/2022 for Annual Exam   Pt presents today for annual physical exam. States she has been doing well overall. She was just seen by GYN and had PAP an mammogram. Records have been requested. She was seen by dentist recently and they noticed a lesion on her tongue, was sent to oral surgeon for biopsy, has follow up Monday. Reports compliance with her hypertensive regimen, no associated side effects. No chest pain, leg swelling, shortness of breath, headaches, visual changes, confusion, weakness, dizziness, or syncope. Last triglyceride level was elevated but pt was not fasting.      Relevant past medical, surgical, family, and social history reviewed and updated as indicated.  Allergies and medications reviewed and updated. Data reviewed: Chart in Epic.   History reviewed. No pertinent past medical history.  Past Surgical History:  Procedure Laterality Date   CESAREAN SECTION     times 2    FRACTURE SURGERY     ankle    Social History   Socioeconomic History   Marital status: Married    Spouse name: Not on file   Number of children: Not on file   Years of education: Not on file   Highest education level: Not on file  Occupational History   Not on file  Tobacco Use   Smoking status: Never   Smokeless tobacco: Never  Substance and Sexual Activity   Alcohol use: No   Drug use: No   Sexual activity: Yes    Birth control/protection: Pill  Other Topics Concern   Not on file  Social History Narrative   Not on file   Social Determinants of Health   Financial Resource Strain: Not on file  Food Insecurity: Not on file  Transportation Needs: Not on file  Physical Activity: Not on file  Stress: Not on file   Social Connections: Not on file  Intimate Partner Violence: Not on file    Outpatient Encounter Medications as of 07/06/2022  Medication Sig   Norgestimate-Ethinyl Estradiol Triphasic (ORTHO TRI-CYCLEN, 28,) 0.18/0.215/0.25 MG-35 MCG tablet Take 1 tablet by mouth daily.   valsartan-hydrochlorothiazide (DIOVAN-HCT) 80-12.5 MG tablet Take 1 tablet by mouth daily.   [DISCONTINUED] ALPRAZolam (XANAX) 0.5 MG tablet Take 0.25-0.5 mg by mouth daily as needed.   [DISCONTINUED] hydrochlorothiazide (HYDRODIURIL) 25 MG tablet Take 1 tablet (25 mg total) by mouth daily.   No facility-administered encounter medications on file as of 07/06/2022.    No Known Allergies  Review of Systems  Constitutional:  Negative for activity change, appetite change, chills, diaphoresis, fatigue, fever and unexpected weight change.  HENT: Negative.    Eyes: Negative.  Negative for photophobia and visual disturbance.  Respiratory:  Negative for cough, chest tightness and shortness of breath.   Cardiovascular:  Negative for chest pain, palpitations and leg swelling.  Gastrointestinal:  Negative for abdominal pain, blood in stool, constipation, diarrhea, nausea and vomiting.  Endocrine: Negative.   Genitourinary:  Negative for decreased urine volume, difficulty urinating, dysuria, frequency and urgency.  Musculoskeletal:  Negative for arthralgias and myalgias.  Skin: Negative.   Allergic/Immunologic: Negative.   Neurological:  Negative for dizziness, tremors, seizures, syncope, facial asymmetry, speech difficulty, weakness, light-headedness, numbness and  headaches.  Hematological: Negative.   Psychiatric/Behavioral:  Negative for confusion, hallucinations, sleep disturbance and suicidal ideas.   All other systems reviewed and are negative.       Objective:  BP 123/87   Pulse 79   Temp 98.9 F (37.2 C)   Ht 5' 4"  (1.626 m)   Wt 166 lb 8 oz (75.5 kg)   LMP 07/06/2022   SpO2 94%   BMI 28.58 kg/m    Wt  Readings from Last 3 Encounters:  07/06/22 166 lb 8 oz (75.5 kg)  01/06/22 167 lb 3.2 oz (75.8 kg)  10/17/12 176 lb (79.8 kg)    Physical Exam Vitals and nursing note reviewed.  Constitutional:      General: She is not in acute distress.    Appearance: Normal appearance. She is well-developed, well-groomed and overweight. She is not ill-appearing, toxic-appearing or diaphoretic.  HENT:     Head: Normocephalic and atraumatic.     Jaw: There is normal jaw occlusion.     Right Ear: Hearing, tympanic membrane, ear canal and external ear normal.     Left Ear: Hearing, tympanic membrane, ear canal and external ear normal.     Nose: Nose normal.     Mouth/Throat:     Lips: Pink.     Mouth: Mucous membranes are moist.     Pharynx: Oropharynx is clear. Uvula midline. No oropharyngeal exudate or posterior oropharyngeal erythema.   Eyes:     General: Lids are normal.     Extraocular Movements: Extraocular movements intact.     Conjunctiva/sclera: Conjunctivae normal.     Pupils: Pupils are equal, round, and reactive to light.  Neck:     Thyroid: No thyroid mass, thyromegaly or thyroid tenderness.     Vascular: No carotid bruit or JVD.     Trachea: Trachea and phonation normal.  Cardiovascular:     Rate and Rhythm: Normal rate and regular rhythm.     Chest Wall: PMI is not displaced.     Pulses: Normal pulses.     Heart sounds: Normal heart sounds. No murmur heard.    No friction rub. No gallop.  Pulmonary:     Effort: Pulmonary effort is normal. No respiratory distress.     Breath sounds: Normal breath sounds. No wheezing.  Abdominal:     General: Bowel sounds are normal. There is no distension or abdominal bruit.     Palpations: Abdomen is soft. There is no hepatomegaly or splenomegaly.     Tenderness: There is no abdominal tenderness. There is no right CVA tenderness or left CVA tenderness.     Hernia: No hernia is present.  Genitourinary:    Comments: Deferred, sees  GYN Musculoskeletal:        General: Normal range of motion.     Cervical back: Normal range of motion and neck supple.     Right lower leg: No edema.     Left lower leg: No edema.  Lymphadenopathy:     Cervical: No cervical adenopathy.  Skin:    General: Skin is warm and dry.     Capillary Refill: Capillary refill takes less than 2 seconds.     Coloration: Skin is not cyanotic, jaundiced or pale.     Findings: No rash.  Neurological:     General: No focal deficit present.     Mental Status: She is alert and oriented to person, place, and time.     Cranial Nerves: No cranial nerve deficit.  Sensory: Sensation is intact. No sensory deficit.     Motor: Motor function is intact. No weakness.     Coordination: Coordination is intact. Coordination normal.     Gait: Gait is intact. Gait normal.     Deep Tendon Reflexes: Reflexes are normal and symmetric. Reflexes normal.  Psychiatric:        Attention and Perception: Attention and perception normal.        Mood and Affect: Mood and affect normal.        Speech: Speech normal.        Behavior: Behavior normal. Behavior is cooperative.        Thought Content: Thought content normal.        Cognition and Memory: Cognition and memory normal.        Judgment: Judgment normal.     Results for orders placed or performed in visit on 01/06/22  Thyroid Panel With TSH  Result Value Ref Range   TSH 1.340 0.450 - 4.500 uIU/mL   T4, Total 10.2 4.5 - 12.0 ug/dL   T3 Uptake Ratio 22 (L) 24 - 39 %   Free Thyroxine Index 2.2 1.2 - 4.9  Basic Metabolic Panel  Result Value Ref Range   Glucose 99 70 - 99 mg/dL   BUN 10 6 - 24 mg/dL   Creatinine, Ser 0.69 0.57 - 1.00 mg/dL   eGFR 110 >59 mL/min/1.73   BUN/Creatinine Ratio 14 9 - 23   Sodium 136 134 - 144 mmol/L   Potassium 4.0 3.5 - 5.2 mmol/L   Chloride 95 (L) 96 - 106 mmol/L   CO2 21 20 - 29 mmol/L   Calcium 10.0 8.7 - 10.2 mg/dL  Microalbumin/Creatinine Ratio, Urine  Result Value Ref  Range   Creatinine, Urine 60.3 Not Estab. mg/dL   Microalbumin, Urine 18.1 Not Estab. ug/mL   Microalb/Creat Ratio 30 (H) 0 - 29 mg/g creat  Lipid Panel  Result Value Ref Range   Cholesterol, Total 188 100 - 199 mg/dL   Triglycerides 153 (H) 0 - 149 mg/dL   HDL 70 >39 mg/dL   VLDL Cholesterol Cal 26 5 - 40 mg/dL   LDL Chol Calc (NIH) 92 0 - 99 mg/dL   Chol/HDL Ratio 2.7 0.0 - 4.4 ratio       Pertinent labs & imaging results that were available during my care of the patient were reviewed by me and considered in my medical decision making.  Assessment & Plan:  Allora was seen today for annual exam.  Diagnoses and all orders for this visit:  Annual physical exam Pt not fasting today, will come back for fasting labs. Diet and exercise encouraged. Health maintenance discussed in detail.  -     CBC with Differential/Platelet; Future -     CMP14+EGFR; Future -     Lipid panel; Future -     Thyroid Panel With TSH; Future  Screening for thyroid disorder Labs pending.   Screening for metabolic disorder Labs pending.   Screening for deficiency anemia Labs pending.   Screening for lipid disorders Labs pending.     Continue all other maintenance medications.  Follow up plan: Return in about 1 year (around 07/07/2023), or if symptoms worsen or fail to improve.   Continue healthy lifestyle choices, including diet (rich in fruits, vegetables, and lean proteins, and low in salt and simple carbohydrates) and exercise (at least 30 minutes of moderate physical activity daily).  Educational handout given for health maintenance  The above assessment and management plan was discussed with the patient. The patient verbalized understanding of and has agreed to the management plan. Patient is aware to call the clinic if they develop any new symptoms or if symptoms persist or worsen. Patient is aware when to return to the clinic for a follow-up visit. Patient educated on when it is  appropriate to go to the emergency department.   Monia Pouch, FNP-C [facility] [phone]

## 2023-06-07 ENCOUNTER — Other Ambulatory Visit: Payer: Self-pay | Admitting: Family Medicine

## 2023-06-07 MED ORDER — VALSARTAN-HYDROCHLOROTHIAZIDE 80-12.5 MG PO TABS: 1.0000 | ORAL_TABLET | Freq: Every day | ORAL | 0 refills | Status: DC

## 2023-06-07 MED ORDER — NORGESTIM-ETH ESTRAD TRIPHASIC 0.18/0.215/0.25 MG-35 MCG PO TABS: 1.0000 | ORAL_TABLET | Freq: Every day | ORAL | 0 refills | Status: DC

## 2023-06-07 NOTE — Telephone Encounter (Signed)
  Prescription Request  06/07/2023   What is the name of the medication or equipment? ALL  Have you contacted your pharmacy to request a refill? YES  Which pharmacy would you like this sent to? [facility]  Pt needs refills on her meds to last her until her next appt in October. (No openings for CPE before then)   Patient notified that their request is being sent to the clinical staff for review and that they should receive a response within 2 business days.

## 2023-06-07 NOTE — Telephone Encounter (Signed)
LMOVM refill sent to pharmacy 

## 2023-09-21 ENCOUNTER — Ambulatory Visit (INDEPENDENT_AMBULATORY_CARE_PROVIDER_SITE_OTHER): Payer: Managed Care, Other (non HMO) | Admitting: Family Medicine

## 2023-09-21 ENCOUNTER — Encounter: Payer: Self-pay | Admitting: Family Medicine

## 2023-09-21 VITALS — BP 127/83 | HR 80 | Temp 97.6°F | Ht 64.0 in | Wt 174.4 lb

## 2023-09-21 DIAGNOSIS — Z130 Encounter for screening for diseases of the blood and blood-forming organs and certain disorders involving the immune mechanism: Secondary | ICD-10-CM

## 2023-09-21 DIAGNOSIS — Z1329 Encounter for screening for other suspected endocrine disorder: Secondary | ICD-10-CM | POA: Diagnosis not present

## 2023-09-21 DIAGNOSIS — Z0000 Encounter for general adult medical examination without abnormal findings: Secondary | ICD-10-CM | POA: Diagnosis not present

## 2023-09-21 DIAGNOSIS — Z13220 Encounter for screening for lipoid disorders: Secondary | ICD-10-CM

## 2023-09-21 DIAGNOSIS — Z3041 Encounter for surveillance of contraceptive pills: Secondary | ICD-10-CM

## 2023-09-21 DIAGNOSIS — I10 Essential (primary) hypertension: Secondary | ICD-10-CM

## 2023-09-21 DIAGNOSIS — Z13228 Encounter for screening for other metabolic disorders: Secondary | ICD-10-CM

## 2023-09-21 MED ORDER — NORGESTIM-ETH ESTRAD TRIPHASIC 0.18/0.215/0.25 MG-35 MCG PO TABS: 1.0000 | ORAL_TABLET | Freq: Every day | ORAL | 11 refills | Status: AC

## 2023-09-21 MED ORDER — VALSARTAN-HYDROCHLOROTHIAZIDE 80-12.5 MG PO TABS: 1.0000 | ORAL_TABLET | Freq: Every day | ORAL | 3 refills | Status: DC

## 2023-09-21 NOTE — Progress Notes (Signed)
Complete physical exam  Patient: Lauren Serrano   DOB: [birth year]   47 y.o. Female  MRN: [id number]  Subjective:    Chief Complaint  Patient presents with   Annual Exam    Lauren Serrano is a 47 y.o. female who presents today for a complete physical exam. She reports consuming a general diet. Home exercise routine includes elliptical, walking, and kick ball. She generally feels well. She reports sleeping well. She does not have additional problems to discuss today.   Discussed the use of AI scribe software for clinical note transcription with the patient, who gave verbal consent to proceed.  History of Present Illness   The patient presents today for annual physical exam. She has a history of hypertension, is currently on Diovan HCTZ and Ortho Tri-Cyclen. They deny any new health issues or changes in medication. They report no adverse effects from their current medication regimen, including cough or increased urination. They continue to see a gynecologist and had a recent visit a couple of months ago, but did not require a PAP smear due to a history of normal results.  They deny any family history of stroke or DVT, and does not smoke.  The patient has not yet undergone a colonoscopy and is considering options for colorectal cancer screening, including Cologuard or a colonoscopy. They deny any abnormal bowel movements or blood in the stool. They have no family history of colorectal cancer.  The patient reports some changes in vision, suggesting the need for reading glasses, but continues to have regular eye exams. They have regular dental check-ups every six months. They deny any recent illnesses or surgeries.  The patient maintains an active lifestyle, including daily workouts on the elliptical, participation in a kickball league, and nightly walks. They report some morning soreness, which they attribute to their active lifestyle and possibly their mattress. They deny any concerning  symptoms during workouts, such as chest pain, shortness of breath, or palpitations.  The patient's menstrual cycles remain regular with the use of Ortho Tri-Cyclen. They have no new family history of disease.      Most recent fall risk assessment:    09/21/2023    2:59 PM  Fall Risk   Falls in the past year? 0     Most recent depression screenings:    09/21/2023    2:59 PM 07/06/2022    2:26 PM  PHQ 2/9 Scores  PHQ - 2 Score 0 0  PHQ- 9 Score 0 0    Vision:Within last year and Dental: No current dental problems and Receives regular dental care  Patient Active Problem List   Diagnosis Date Noted   Lichen sclerosus of vulva 01/13/2022   Primary hypertension 01/06/2022   Previous cesarean delivery, delivered 10/18/2012   History reviewed. No pertinent past medical history. Past Surgical History:  Procedure Laterality Date   CESAREAN SECTION     times 2    FRACTURE SURGERY     ankle   Social History   Tobacco Use   Smoking status: Never   Smokeless tobacco: Never  Substance Use Topics   Alcohol use: No   Drug use: No   Social History   Socioeconomic History   Marital status: Married    Spouse name: Not on file   Number of children: Not on file   Years of education: Not on file   Highest education level: Not on file  Occupational History   Not on file  Tobacco Use  Smoking status: Never   Smokeless tobacco: Never  Substance and Sexual Activity   Alcohol use: No   Drug use: No   Sexual activity: Yes    Birth control/protection: Pill  Other Topics Concern   Not on file  Social History Narrative   Not on file   Social Determinants of Health   Financial Resource Strain: Not on file  Food Insecurity: Not on file  Transportation Needs: Not on file  Physical Activity: Not on file  Stress: Not on file  Social Connections: Not on file  Intimate Partner Violence: Not on file   Family Status  Relation Name Status   Mother  Alive   Father  Alive    MGM  Deceased   MGF  Deceased   PGM  Deceased   PGF  Deceased  No partnership data on file   Family History  Problem Relation Age of Onset   Heart disease Mother    Diabetes Father    Heart disease Maternal Grandmother    Cancer Maternal Grandfather    Cancer Paternal Grandmother    No Known Allergies    Patient Care Team: Kennadi Albany, Doralee Albino, FNP as PCP - General (Family Medicine)   Outpatient Medications Prior to Visit  Medication Sig   [DISCONTINUED] valsartan-hydrochlorothiazide (DIOVAN-HCT) 80-12.5 MG tablet Take 1 tablet by mouth daily.   [DISCONTINUED] Norgestimate-Ethinyl Estradiol Triphasic (ORTHO TRI-CYCLEN, 28,) 0.18/0.215/0.25 MG-35 MCG tablet Take 1 tablet by mouth daily.   No facility-administered medications prior to visit.    ROS per HPI, otherwise negative.       Objective:     BP 127/83   Pulse 80   Temp 97.6 F (36.4 C) (Temporal)   Ht 5\' 4"  (1.626 m)   Wt 174 lb 6.4 oz (79.1 kg)   LMP 08/31/2023   SpO2 94%   BMI 29.94 kg/m  BP Readings from Last 3 Encounters:  09/21/23 127/83  07/06/22 123/87  01/06/22 129/89   Wt Readings from Last 3 Encounters:  09/21/23 174 lb 6.4 oz (79.1 kg)  07/06/22 166 lb 8 oz (75.5 kg)  01/06/22 167 lb 3.2 oz (75.8 kg)   SpO2 Readings from Last 3 Encounters:  09/21/23 94%  07/06/22 94%      Physical Exam Vitals and nursing note reviewed.  Constitutional:      General: She is not in acute distress.    Appearance: Normal appearance. She is well-developed, well-groomed and overweight. She is not ill-appearing, toxic-appearing or diaphoretic.  HENT:     Head: Normocephalic and atraumatic.     Jaw: There is normal jaw occlusion.     Right Ear: Hearing, tympanic membrane, ear canal and external ear normal.     Left Ear: Hearing, tympanic membrane, ear canal and external ear normal.     Nose: Nose normal.     Mouth/Throat:     Lips: Pink.     Mouth: Mucous membranes are moist.     Pharynx: Oropharynx is  clear. Uvula midline.  Eyes:     General: Lids are normal.     Extraocular Movements: Extraocular movements intact.     Conjunctiva/sclera: Conjunctivae normal.     Pupils: Pupils are equal, round, and reactive to light.  Neck:     Thyroid: No thyroid mass, thyromegaly or thyroid tenderness.     Vascular: No carotid bruit or JVD.     Trachea: Trachea and phonation normal.  Cardiovascular:     Rate and Rhythm: Normal rate and  regular rhythm.     Chest Wall: PMI is not displaced.     Pulses: Normal pulses.     Heart sounds: Normal heart sounds. No murmur heard.    No friction rub. No gallop.  Pulmonary:     Effort: Pulmonary effort is normal. No respiratory distress.     Breath sounds: Normal breath sounds. No wheezing.  Abdominal:     General: Bowel sounds are normal. There is no distension or abdominal bruit.     Palpations: Abdomen is soft. There is no hepatomegaly or splenomegaly.     Tenderness: There is no abdominal tenderness. There is no right CVA tenderness or left CVA tenderness.     Hernia: No hernia is present.  Genitourinary:    Comments: Deferred, sees GYN Musculoskeletal:        General: Normal range of motion.     Cervical back: Normal range of motion and neck supple.     Right lower leg: No edema.     Left lower leg: No edema.  Lymphadenopathy:     Cervical: No cervical adenopathy.  Skin:    General: Skin is warm and dry.     Capillary Refill: Capillary refill takes less than 2 seconds.     Coloration: Skin is not cyanotic, jaundiced or pale.     Findings: No rash.  Neurological:     General: No focal deficit present.     Mental Status: She is alert and oriented to person, place, and time.     Sensory: Sensation is intact.     Motor: Motor function is intact.     Coordination: Coordination is intact.     Gait: Gait is intact.     Deep Tendon Reflexes: Reflexes are normal and symmetric.  Psychiatric:        Attention and Perception: Attention and  perception normal.        Mood and Affect: Mood and affect normal.        Speech: Speech normal.        Behavior: Behavior normal. Behavior is cooperative.        Thought Content: Thought content normal.        Cognition and Memory: Cognition and memory normal.        Judgment: Judgment normal.       Last CBC Lab Results  Component Value Date   WBC 8.9 06/30/2009   HGB 9.0 (L) 06/30/2009   HCT 26.4 (L) 06/30/2009   MCV 76.7 (L) 06/30/2009   RDW 17.0 (H) 06/30/2009   PLT 119 (L) 06/30/2009   Last metabolic panel Lab Results  Component Value Date   GLUCOSE 99 01/06/2022   NA 136 01/06/2022   K 4.0 01/06/2022   CL 95 (L) 01/06/2022   CO2 21 01/06/2022   BUN 10 01/06/2022   CREATININE 0.69 01/06/2022   EGFR 110 01/06/2022   CALCIUM 10.0 01/06/2022   Last lipids Lab Results  Component Value Date   CHOL 188 01/06/2022   HDL 70 01/06/2022   LDLCALC 92 01/06/2022   TRIG 153 (H) 01/06/2022   CHOLHDL 2.7 01/06/2022    Last thyroid functions Lab Results  Component Value Date   TSH 1.340 01/06/2022   T4TOTAL 10.2 01/06/2022       Assessment & Plan:    Routine Health Maintenance and Physical Exam  Immunization History  Administered Date(s) Administered   Tdap 01/06/2022    Health Maintenance  Topic Date Due   Colonoscopy  Never  done   COVID-19 Vaccine (1 - 2023-24 season) 10/07/2023 (Originally 07/30/2023)   INFLUENZA VACCINE  02/26/2024 (Originally 06/29/2023)   Cervical Cancer Screening (HPV/Pap Cotest)  10/15/2024   DTaP/Tdap/Td (2 - Td or Tdap) 01/07/2032   Hepatitis C Screening  Completed   HIV Screening  Completed   HPV VACCINES  Aged Out    Discussed health benefits of physical activity, and encouraged her to engage in regular exercise appropriate for her age and condition.  Problem List Items Addressed This Visit       Cardiovascular and Mediastinum   Primary hypertension   Relevant Medications   valsartan-hydrochlorothiazide (DIOVAN-HCT)  80-12.5 MG tablet   Other Visit Diagnoses     Annual physical exam    -  Primary   Relevant Orders   Anemia Profile B   CMP14+EGFR   Lipid panel   Thyroid Panel With TSH   Encounter for surveillance of contraceptive pills       Relevant Medications   Norgestimate-Ethinyl Estradiol Triphasic (ORTHO TRI-CYCLEN, 28,) 0.18/0.215/0.25 MG-35 MCG tablet   Screening for thyroid disorder       Relevant Orders   Thyroid Panel With TSH   Screening for metabolic disorder       Relevant Orders   CMP14+EGFR   Thyroid Panel With TSH   Screening for deficiency anemia       Relevant Orders   Anemia Profile B   Screening for lipid disorders       Relevant Orders   Lipid panel         Assessment and Plan    Hypertension Well controlled on Diovan HCTZ. No reported side effects. -Continue Diovan HCTZ.  Oral Contraceptive Use On Ortho Tricycline. No reported side effects. Discussed risk of stroke and DVT with hypertension and contraceptive use. -Continue Ortho Tricycline. -Monitor for signs of shortness of breath, swelling in one leg, palpitations.  Cervical Cancer Screening Last PAP smear a few months ago. No abnormalities reported. -Continue regular GYN follow-ups.  Colorectal Cancer Screening Discussed options for colonoscopy and Cologuard. Patient to decide and inform. -Order colonoscopy or Cologuard based on patient's decision.  General Health Maintenance -Order labs for thyroid function, diabetes, anemia, and cholesterol. -Refill medications for a year. -Plan for annual physical unless something comes up.       Return in about 1 year (around 09/20/2024), or if symptoms worsen or fail to improve, for CPE.   The above assessment and management plan was discussed with the patient. The patient verbalized understanding of and has agreed to the management plan. Patient is aware to call the clinic if they develop any new symptoms or if symptoms fail to improve or worsen. Patient  is aware when to return to the clinic for a follow-up visit. Patient educated on when it is appropriate to go to the emergency department.   Kari Baars, FNP-C [facility] [address] [place], [place] [address] [phone]

## 2023-09-22 LAB — THYROID PANEL WITH TSH
Free Thyroxine Index: 1.8 (ref 1.2–4.9)
T3 Uptake Ratio: 19 % — ABNORMAL LOW (ref 24–39)
T4, Total: 9.3 ug/dL (ref 4.5–12.0)
TSH: 1.83 u[IU]/mL (ref 0.450–4.500)

## 2023-09-22 LAB — ANEMIA PROFILE B
Basophils Absolute: 0.1 10*3/uL (ref 0.0–0.2)
Basos: 1 %
EOS (ABSOLUTE): 0.1 10*3/uL (ref 0.0–0.4)
Eos: 2 %
Ferritin: 40 ng/mL (ref 15–150)
Folate: 9.6 ng/mL (ref >3.0)
Hematocrit: 38.3 % (ref 34.0–46.6)
Hemoglobin: 12.7 g/dL (ref 11.1–15.9)
Immature Grans (Abs): 0 10*3/uL (ref 0.0–0.1)
Immature Granulocytes: 0 %
Iron Saturation: 26 % (ref 15–55)
Iron: 102 ug/dL (ref 27–159)
Lymphocytes Absolute: 2 10*3/uL (ref 0.7–3.1)
Lymphs: 31 %
MCH: 30.1 pg (ref 26.6–33.0)
MCHC: 33.2 g/dL (ref 31.5–35.7)
MCV: 91 fL (ref 79–97)
Monocytes Absolute: 0.4 10*3/uL (ref 0.1–0.9)
Monocytes: 6 %
Neutrophils Absolute: 3.8 10*3/uL (ref 1.4–7.0)
Neutrophils: 60 %
Platelets: 252 10*3/uL (ref 150–450)
RBC: 4.22 x10E6/uL (ref 3.77–5.28)
RDW: 12.3 % (ref 11.7–15.4)
Retic Ct Pct: 1.3 % (ref 0.6–2.6)
Total Iron Binding Capacity: 386 ug/dL (ref 250–450)
UIBC: 284 ug/dL (ref 131–425)
Vitamin B-12: 336 pg/mL (ref 232–1245)
WBC: 6.3 10*3/uL (ref 3.4–10.8)

## 2023-09-22 LAB — LIPID PANEL
Chol/HDL Ratio: 2.4 ratio (ref 0.0–4.4)
Cholesterol, Total: 163 mg/dL (ref 100–199)
HDL: 68 mg/dL (ref >39)
LDL Chol Calc (NIH): 73 mg/dL (ref 0–99)
Triglycerides: 125 mg/dL (ref 0–149)
VLDL Cholesterol Cal: 22 mg/dL (ref 5–40)

## 2023-09-22 LAB — CMP14+EGFR
ALT: 13 [IU]/L (ref 0–32)
AST: 14 [IU]/L (ref 0–40)
Albumin: 4.2 g/dL (ref 3.9–4.9)
Alkaline Phosphatase: 44 [IU]/L (ref 44–121)
BUN/Creatinine Ratio: 14 (ref 9–23)
BUN: 8 mg/dL (ref 6–24)
Bilirubin Total: 0.4 mg/dL (ref 0.0–1.2)
CO2: 23 mmol/L (ref 20–29)
Calcium: 9.5 mg/dL (ref 8.7–10.2)
Chloride: 102 mmol/L (ref 96–106)
Creatinine, Ser: 0.57 mg/dL (ref 0.57–1.00)
Globulin, Total: 2.6 g/dL (ref 1.5–4.5)
Glucose: 92 mg/dL (ref 70–99)
Potassium: 4.3 mmol/L (ref 3.5–5.2)
Sodium: 137 mmol/L (ref 134–144)
Total Protein: 6.8 g/dL (ref 6.0–8.5)
eGFR: 114 mL/min/{1.73_m2} (ref >59)

## 2024-07-17 ENCOUNTER — Telehealth: Payer: Self-pay

## 2024-07-17 DIAGNOSIS — Z1211 Encounter for screening for malignant neoplasm of colon: Secondary | ICD-10-CM

## 2024-07-17 NOTE — Telephone Encounter (Signed)
 Copied from CRM [id number]. Topic: General - Other >> Jul 17, 2024  9:39 AM Travis F wrote: Reason for CRM: Patient is calling in because she would like to have a colonoscopy scheduled.

## 2024-07-17 NOTE — Telephone Encounter (Signed)
 09/21/23- had CPE- is it ok to order?

## 2024-07-18 NOTE — Telephone Encounter (Signed)
 Lmt[facility] Preferred location?

## 2024-07-19 ENCOUNTER — Telehealth: Payer: Self-pay | Admitting: Family Medicine

## 2024-07-19 NOTE — Telephone Encounter (Signed)
 Copied from CRM [id number]. Topic: General - Other >> Jul 17, 2024  9:39 AM Travis F wrote: Reason for CRM: Patient is calling in because she would like to have a colonoscopy scheduled. >> Jul 18, 2024  4:02 PM Sasha H wrote: Pt states she has no preference for her colonoscopy, as long as it is in [place]

## 2024-07-19 NOTE — Addendum Note (Signed)
 Addended by: OLENA HARLENE BROCKS on: 07/19/2024 12:35 PM   Modules accepted: Orders

## 2024-07-19 NOTE — Telephone Encounter (Signed)
 Referral placed.

## 2024-07-19 NOTE — Telephone Encounter (Signed)
Refer to phone encounter. 

## 2024-07-23 ENCOUNTER — Telehealth: Payer: Self-pay | Admitting: Family Medicine

## 2024-07-23 NOTE — Telephone Encounter (Signed)
 Referral sent to: [facility] [address], [place] [address] [phone]  MyChart Message sent to Patient with Specialty Office contact information.

## 2024-07-23 NOTE — Telephone Encounter (Unsigned)
 Copied from CRM #[id number]. Topic: Referral - Question >> Jul 23, 2024 11:42 AM [name] wrote: Reason for CRM: Pt called In to get location fo where referral to colonoscopy was placed, looked at referral and did not see a location.    Pt would like to know.   Pls follow up with pt with request

## 2024-08-05 ENCOUNTER — Encounter: Payer: Self-pay | Admitting: Internal Medicine

## 2024-08-30 ENCOUNTER — Encounter: Payer: Self-pay | Admitting: Internal Medicine

## 2024-08-30 ENCOUNTER — Ambulatory Visit (AMBULATORY_SURGERY_CENTER): Payer: Self-pay

## 2024-08-30 VITALS — Ht 64.0 in | Wt 170.0 lb

## 2024-08-30 DIAGNOSIS — Z1211 Encounter for screening for malignant neoplasm of colon: Secondary | ICD-10-CM

## 2024-08-30 NOTE — Progress Notes (Signed)

## 2024-09-01 ENCOUNTER — Other Ambulatory Visit: Payer: Self-pay | Admitting: Family Medicine

## 2024-09-01 DIAGNOSIS — I10 Essential (primary) hypertension: Secondary | ICD-10-CM

## 2024-09-11 ENCOUNTER — Other Ambulatory Visit: Payer: Self-pay

## 2024-09-11 ENCOUNTER — Telehealth: Payer: Self-pay | Admitting: Internal Medicine

## 2024-09-11 MED ORDER — NA SULFATE-K SULFATE-MG SULF 17.5-3.13-1.6 GM/177ML PO SOLN: 1.0000 | Freq: Once | ORAL | 0 refills | Status: AC

## 2024-09-11 NOTE — Telephone Encounter (Signed)
 RN returned call to patient who needed her prep medication sent to her pharmacy. RN sent prescription order per standing order and left vm with patient about this.

## 2024-09-11 NOTE — Telephone Encounter (Signed)
 Patient needing prep medication sent into [facility] in [place] on [address]. Procedure for 10/17.

## 2024-09-13 ENCOUNTER — Ambulatory Visit (AMBULATORY_SURGERY_CENTER): Payer: Self-pay | Admitting: Internal Medicine

## 2024-09-13 ENCOUNTER — Encounter: Payer: Self-pay | Admitting: Internal Medicine

## 2024-09-13 VITALS — BP 116/95 | HR 64 | Temp 97.2°F | Resp 10 | Ht 64.0 in | Wt 170.0 lb

## 2024-09-13 DIAGNOSIS — K5730 Diverticulosis of large intestine without perforation or abscess without bleeding: Secondary | ICD-10-CM

## 2024-09-13 DIAGNOSIS — Z1211 Encounter for screening for malignant neoplasm of colon: Secondary | ICD-10-CM | POA: Diagnosis present

## 2024-09-13 MED ORDER — SODIUM CHLORIDE 0.9 % IV SOLN: 500.0000 mL | Freq: Once | INTRAVENOUS | Status: DC

## 2024-09-13 NOTE — Progress Notes (Signed)
 Vss nad trans to pacu

## 2024-09-13 NOTE — Progress Notes (Signed)
 HISTORY OF PRESENT ILLNESS:  Lauren Serrano is a 48 y.o. female presents for routine screening colonoscopy.  No complaints  REVIEW OF SYSTEMS:  All non-GI ROS negative except for  Past Medical History:  Diagnosis Date   Hypertension     Past Surgical History:  Procedure Laterality Date   CESAREAN SECTION     times 2    FRACTURE SURGERY     ankle    Social History Ellasyn TAMIRRA SIENKIEWICZ  reports that she has never smoked. She has never used smokeless tobacco. She reports that she does not drink alcohol and does not use drugs.  family history includes Cancer in her maternal grandfather and paternal grandmother; Diabetes in her father; Heart disease in her maternal grandmother and mother.  No Known Allergies     PHYSICAL EXAMINATION: Vital signs: BP (!) 122/90   Pulse 68   Temp (!) 97.2 F (36.2 C)   Ht 5' 4 (1.626 m)   Wt 170 lb (77.1 kg)   SpO2 98%   BMI 29.18 kg/m  General: Well-developed, well-nourished, no acute distress HEENT: Sclerae are anicteric, conjunctiva pink. Oral mucosa intact Lungs: Clear Heart: Regular Abdomen: soft, nontender, nondistended, no obvious ascites, no peritoneal signs, normal bowel sounds. No organomegaly. Extremities: No edema Psychiatric: alert and oriented x3. Cooperative     ASSESSMENT:  Colon cancer screening   PLAN:  Screening colonoscopy

## 2024-09-13 NOTE — Progress Notes (Signed)
 Pt's states no medical or surgical changes since previsit or office visit.

## 2024-09-13 NOTE — Op Note (Signed)
 [facility] Patient Name: Lauren Serrano Procedure Date: 09/13/2024 8:11 AM MRN: [id number] Endoscopist: Norleen SAILOR. Abran , MD, [id number] Age: 48 Referring MD:  Date of Birth: Jun 12, 1976 Gender: Female Account #: [id number] Procedure:                Colonoscopy Indications:              Screening for colorectal malignant neoplasm Medicines:                Monitored Anesthesia Care Procedure:                Pre-Anesthesia Assessment:                           - Prior to the procedure, a History and Physical                            was performed, and patient medications and                            allergies were reviewed. The patient's tolerance of                            previous anesthesia was also reviewed. The risks                            and benefits of the procedure and the sedation                            options and risks were discussed with the patient.                            All questions were answered, and informed consent                            was obtained. Prior Anticoagulants: The patient has                            taken no anticoagulant or antiplatelet agents. ASA                            Grade Assessment: II - A patient with mild systemic                            disease. After reviewing the risks and benefits,                            the patient was deemed in satisfactory condition to                            undergo the procedure.                           After obtaining informed consent, the colonoscope  was passed under direct vision. Throughout the                            procedure, the patient's blood pressure, pulse, and                            oxygen saturations were monitored continuously. The                            CF [id number] #[id number] was introduced through the anus                            and advanced to the the cecum, identified by                            appendiceal  orifice and ileocecal valve. The                            ileocecal valve, appendiceal orifice, and rectum                            were photographed. The quality of the bowel                            preparation was excellent. The colonoscopy was                            performed without difficulty. The patient tolerated                            the procedure well. The bowel preparation used was                            SUPREP via split dose instruction. Scope In: 8:31:36 AM Scope Out: 8:41:08 AM Scope Withdrawal Time: 0 hours 6 minutes 46 seconds  Total Procedure Duration: 0 hours 9 minutes 32 seconds  Findings:                 Diverticula were found in the sigmoid colon.                           The exam was otherwise without abnormality on                            direct and retroflexion views. Complications:            No immediate complications. Estimated blood loss:                            None. Estimated Blood Loss:     Estimated blood loss: none. Impression:               - Diverticulosis in the sigmoid colon.                           - The  examination was otherwise normal on direct                            and retroflexion views.                           - No specimens collected. Recommendation:           - Repeat colonoscopy in 10 years for screening                            purposes.                           - Patient has a contact number available for                            emergencies. The signs and symptoms of potential                            delayed complications were discussed with the                            patient. Return to normal activities tomorrow.                            Written discharge instructions were provided to the                            patient.                           - Resume previous diet.                           - Continue present medications. Norleen SAILOR. Abran, MD 09/13/2024 8:46:58 AM This report has  been signed electronically.

## 2024-09-13 NOTE — Patient Instructions (Signed)
Discharge instructions given. Handout on Diverticulosis. Resume previous medications. YOU HAD AN ENDOSCOPIC PROCEDURE TODAY AT THE [facility]:   Refer to the procedure report that was given to you for any specific questions about what was found during the examination.  If the procedure report does not answer your questions, please call your gastroenterologist to clarify.  If you requested that your care partner not be given the details of your procedure findings, then the procedure report has been included in a sealed envelope for you to review at your convenience later.  YOU SHOULD EXPECT: Some feelings of bloating in the abdomen. Passage of more gas than usual.  Walking can help get rid of the air that was put into your GI tract during the procedure and reduce the bloating. If you had a lower endoscopy (such as a colonoscopy or flexible sigmoidoscopy) you may notice spotting of blood in your stool or on the toilet paper. If you underwent a bowel prep for your procedure, you may not have a normal bowel movement for a few days.  Please Note:  You might notice some irritation and congestion in your nose or some drainage.  This is from the oxygen used during your procedure.  There is no need for concern and it should clear up in a day or so.  SYMPTOMS TO REPORT IMMEDIATELY:  Following lower endoscopy (colonoscopy or flexible sigmoidoscopy):  Excessive amounts of blood in the stool  Significant tenderness or worsening of abdominal pains  Swelling of the abdomen that is new, acute  Fever of 100F or higher   For urgent or emergent issues, a gastroenterologist can be reached at any hour by calling [phone]. Do not use MyChart messaging for urgent concerns.    DIET:  We do recommend a small meal at first, but then you may proceed to your regular diet.  Drink plenty of fluids but you should avoid alcoholic beverages for 24 hours.  ACTIVITY:  You should plan to take it easy for  the rest of today and you should NOT DRIVE or use heavy machinery until tomorrow (because of the sedation medicines used during the test).    FOLLOW UP: Our staff will call the number listed on your records the next business day following your procedure.  We will call around 7:15- 8:00 am to check on you and address any questions or concerns that you may have regarding the information given to you following your procedure. If we do not reach you, we will leave a message.     If any biopsies were taken you will be contacted by phone or by letter within the next 1-3 weeks.  Please call us at [phone] if you have not heard about the biopsies in 3 weeks.    SIGNATURES/CONFIDENTIALITY: You and/or your care partner have signed paperwork which will be entered into your electronic medical record.  These signatures attest to the fact that that the information above on your After Visit Summary has been reviewed and is understood.  Full responsibility of the confidentiality of this discharge information lies with you and/or your care-partner.  

## 2024-09-16 ENCOUNTER — Telehealth: Payer: Self-pay | Admitting: *Deleted

## 2024-09-16 NOTE — Telephone Encounter (Signed)
  Follow up Call-     09/13/2024    7:28 AM  Call back number  Post procedure Call Back phone  # [phone]  Permission to leave phone message Yes     Patient questions:  Do you have a fever, pain , or abdominal swelling? No. Pain Score  0 *  Have you tolerated food without any problems? Yes.    Have you been able to return to your normal activities? Yes.    Do you have any questions about your discharge instructions: Diet   No. Medications  No. Follow up visit  No.  Do you have questions or concerns about your Care? No.  Actions: * If pain score is 4 or above: No action needed, pain <4.

## 2024-09-20 ENCOUNTER — Encounter: Payer: Managed Care, Other (non HMO) | Admitting: Family Medicine

## 2024-10-08 ENCOUNTER — Encounter: Payer: Self-pay | Admitting: Family Medicine

## 2024-12-03 ENCOUNTER — Other Ambulatory Visit: Payer: Self-pay | Admitting: Family Medicine

## 2024-12-03 DIAGNOSIS — I10 Essential (primary) hypertension: Secondary | ICD-10-CM

## 2025-01-23 ENCOUNTER — Encounter: Admitting: Family Medicine
